# Patient Record
Sex: Male | Born: 1960 | Race: Black or African American | Hispanic: No | Marital: Married | Smoking: Former smoker
Health system: Southern US, Community
[De-identification: ages and names within clinical notes are randomized; demographics above are authoritative.]

## PROBLEM LIST (undated history)

## (undated) DIAGNOSIS — Z789 Other specified health status: Secondary | ICD-10-CM

## (undated) HISTORY — PX: NO PAST SURGERIES: SHX2092

## (undated) HISTORY — DX: Other specified health status: Z78.9

---

## 1997-11-11 ENCOUNTER — Emergency Department (HOSPITAL_COMMUNITY): Admission: EM | Admit: 1997-11-11 | Discharge: 1997-11-11 | Payer: Self-pay | Admitting: *Deleted

## 1997-12-13 ENCOUNTER — Emergency Department (HOSPITAL_COMMUNITY): Admission: EM | Admit: 1997-12-13 | Discharge: 1997-12-13 | Payer: Self-pay

## 2020-04-08 ENCOUNTER — Other Ambulatory Visit: Payer: Self-pay

## 2020-04-08 ENCOUNTER — Emergency Department (HOSPITAL_COMMUNITY): Payer: Self-pay

## 2020-04-08 ENCOUNTER — Emergency Department (HOSPITAL_COMMUNITY)
Admission: EM | Admit: 2020-04-08 | Discharge: 2020-04-08 | Disposition: A | Discharge status: Home / Self Care | Payer: Self-pay | Attending: Emergency Medicine | Admitting: Emergency Medicine

## 2020-04-08 ENCOUNTER — Encounter (HOSPITAL_COMMUNITY): Payer: Self-pay | Admitting: Pharmacy Technician

## 2020-04-08 DIAGNOSIS — S7011XA Contusion of right thigh, initial encounter: Secondary | ICD-10-CM

## 2020-04-08 DIAGNOSIS — R52 Pain, unspecified: Secondary | ICD-10-CM

## 2020-04-08 DIAGNOSIS — T1490XA Injury, unspecified, initial encounter: Secondary | ICD-10-CM

## 2020-04-08 DIAGNOSIS — Z23 Encounter for immunization: Secondary | ICD-10-CM | POA: Insufficient documentation

## 2020-04-08 DIAGNOSIS — Y92410 Unspecified street and highway as the place of occurrence of the external cause: Secondary | ICD-10-CM | POA: Insufficient documentation

## 2020-04-08 DIAGNOSIS — S79921A Unspecified injury of right thigh, initial encounter: Secondary | ICD-10-CM | POA: Diagnosis present

## 2020-04-08 LAB — I-STAT CHEM 8, ED
BUN: 20 mg/dL (ref 6–20)
Calcium, Ion: 1.21 mmol/L (ref 1.15–1.40)
Chloride: 105 mmol/L (ref 98–111)
Creatinine, Ser: 0.9 mg/dL (ref 0.61–1.24)
Glucose, Bld: 91 mg/dL (ref 70–99)
HCT: 40 % (ref 39.0–52.0)
Hemoglobin: 13.6 g/dL (ref 13.0–17.0)
Potassium: 3.8 mmol/L (ref 3.5–5.1)
Sodium: 139 mmol/L (ref 135–145)
TCO2: 23 mmol/L (ref 22–32)

## 2020-04-08 LAB — CBC
HCT: 37.4 % — ABNORMAL LOW (ref 39.0–52.0)
Hemoglobin: 12.7 g/dL — ABNORMAL LOW (ref 13.0–17.0)
MCH: 33.2 pg (ref 26.0–34.0)
MCHC: 34 g/dL (ref 30.0–36.0)
MCV: 97.9 fL (ref 80.0–100.0)
Platelets: 210 10*3/uL (ref 150–400)
RBC: 3.82 MIL/uL — ABNORMAL LOW (ref 4.22–5.81)
RDW: 14 % (ref 11.5–15.5)
WBC: 5 10*3/uL (ref 4.0–10.5)
nRBC: 0 % (ref 0.0–0.2)

## 2020-04-08 LAB — PROTIME-INR
INR: 1 (ref 0.8–1.2)
Prothrombin Time: 12.8 seconds (ref 11.4–15.2)

## 2020-04-08 LAB — COMPREHENSIVE METABOLIC PANEL
ALT: 23 U/L (ref 0–44)
AST: 28 U/L (ref 15–41)
Albumin: 4.1 g/dL (ref 3.5–5.0)
Alkaline Phosphatase: 46 U/L (ref 38–126)
Anion gap: 12 (ref 5–15)
BUN: 16 mg/dL (ref 6–20)
CO2: 21 mmol/L — ABNORMAL LOW (ref 22–32)
Calcium: 9.2 mg/dL (ref 8.9–10.3)
Chloride: 104 mmol/L (ref 98–111)
Creatinine, Ser: 0.82 mg/dL (ref 0.61–1.24)
GFR, Estimated: >60 mL/min (ref >60)
Glucose, Bld: 95 mg/dL (ref 70–99)
Potassium: 3.8 mmol/L (ref 3.5–5.1)
Sodium: 137 mmol/L (ref 135–145)
Total Bilirubin: 0.3 mg/dL (ref 0.3–1.2)
Total Protein: 7.3 g/dL (ref 6.5–8.1)

## 2020-04-08 LAB — ETHANOL: Alcohol, Ethyl (B): 92 mg/dL — ABNORMAL HIGH (ref <10)

## 2020-04-08 LAB — URINALYSIS, ROUTINE W REFLEX MICROSCOPIC
Bilirubin Urine: NEGATIVE
Glucose, UA: NEGATIVE mg/dL
Hgb urine dipstick: NEGATIVE
Ketones, ur: NEGATIVE mg/dL
Leukocytes,Ua: NEGATIVE
Nitrite: NEGATIVE
Protein, ur: NEGATIVE mg/dL
Specific Gravity, Urine: 1.009 (ref 1.005–1.030)
pH: 5 (ref 5.0–8.0)

## 2020-04-08 LAB — SAMPLE TO BLOOD BANK

## 2020-04-08 LAB — LACTIC ACID, PLASMA: Lactic Acid, Venous: 1.6 mmol/L (ref 0.5–1.9)

## 2020-04-08 MED ORDER — FENTANYL CITRATE (PF) 100 MCG/2ML IJ SOLN
50.0000 ug | Freq: Once | INTRAMUSCULAR | Status: AC
  Administered 2020-04-08: 50 ug via INTRAVENOUS
  Filled 2020-04-08: qty 2

## 2020-04-08 MED ORDER — LACTATED RINGERS IV BOLUS
1000.0000 mL | Freq: Once | INTRAVENOUS | Status: AC
  Administered 2020-04-08: 1000 mL via INTRAVENOUS

## 2020-04-08 MED ORDER — TETANUS-DIPHTH-ACELL PERTUSSIS 5-2.5-18.5 LF-MCG/0.5 IM SUSY
0.5000 mL | PREFILLED_SYRINGE | Freq: Once | INTRAMUSCULAR | Status: AC
  Administered 2020-04-08: 0.5 mL via INTRAMUSCULAR
  Filled 2020-04-08: qty 0.5

## 2020-04-08 MED ORDER — IOHEXOL 300 MG/ML  SOLN
100.0000 mL | Freq: Once | INTRAMUSCULAR | Status: AC | PRN
  Administered 2020-04-08: 100 mL via INTRAVENOUS
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
## 2020-04-08 NOTE — Progress Notes (Signed)
Orthopedic Tech Progress Note Patient Details:  Damon Lewis [birth year] [id number]  Ortho Devices Type of Ortho Device: Crutches Ortho Device/Splint Interventions: Adjustment   Post Interventions Instructions Provided: Poper ambulation with device, Adjustment of device   Verlee Pope E Cordelia Bessinger 04/08/2020, 10:11 PM
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
## 2020-04-08 NOTE — ED Notes (Signed)
MD made aware of pt BP  
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
## 2020-04-08 NOTE — ED Notes (Signed)
Patient transported to CT in stable condition via stretcher 
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
## 2020-04-18 ENCOUNTER — Telehealth (HOSPITAL_COMMUNITY): Payer: Self-pay | Admitting: Emergency Medicine

## 2020-04-18 NOTE — Telephone Encounter (Signed)
Attempted to call patient to discuss incidental findings on CT that will require follow-up. Patient was encouraged to follow-up with PCP on discharge from ED but no follow-up visit seen in EMR. Called primary home number listed, no answer, unable to leave message. No answer at work phone and no message left as this appear to be the [employer] number. Unable to send message to MyChart as patient has not activated this yet.   Gershon Mussel, MD 04/18/2020 2:45PM

## 2020-07-22 ENCOUNTER — Ambulatory Visit: Payer: Self-pay | Admitting: *Deleted

## 2020-07-22 NOTE — Telephone Encounter (Signed)
  Call to patient- he had advised nurse that he has been diagnosed with cataract and needs the removed. Patient is losing his slight.Call to office- scheduled NP appointment for evaluation and referral. Reason for Disposition . [1] Blurred vision or visual changes AND [2] gradual onset (e.g., weeks, months)  Answer Assessment - Initial Assessment Questions 1. DESCRIPTION: "What is the vision loss like? Describe it for me." (e.g., complete vision loss, blurred vision, double vision, floaters, etc.)     caetract needs to be removed 2. LOCATION: "One or both eyes?" If one, ask: "Which eye?"     Both eye 3. SEVERITY: "Can you see anything?" If Yes, ask: "What can you see?" (e.g., fine print)     Complete vision loss from R eye and L eye- blurred vision 4. ONSET: "When did this begin?" "Did it start suddenly or has this been gradual?"     Past couple month has gotten worse. 5. PATTERN: "Does this come and go, or has it been constant since it started?"     Constant vision loss 6. PAIN: "Is there any pain in your eye(s)?"  (Scale 1-10; or mild, moderate, severe)     no 7. CONTACTS-GLASSES: "Do you wear contacts or glasses?"     no 8. CAUSE: "What do you think is causing this visual problem?"     cataract  9. OTHER SYMPTOMS: "Do you have any other symptoms?" (e.g., confusion, headache, arm or leg weakness, speech problems)     no 10. PREGNANCY: "Is there any chance you are pregnant?" "When was your last menstrual period?"       n/a  Protocols used: VISION LOSS OR CHANGE-A-AH

## 2020-07-27 ENCOUNTER — Encounter: Payer: Self-pay | Admitting: Family

## 2020-07-27 ENCOUNTER — Telehealth (INDEPENDENT_AMBULATORY_CARE_PROVIDER_SITE_OTHER): Payer: Self-pay | Admitting: Family

## 2020-07-27 DIAGNOSIS — Z7689 Persons encountering health services in other specified circumstances: Secondary | ICD-10-CM

## 2020-07-27 NOTE — Progress Notes (Signed)
Establish care Cataracts concerns

## 2020-07-27 NOTE — Progress Notes (Signed)
Virtual Visit via Telephone Note  I connected with Damon Lewis, on 07/27/2020 at 2:31 PM by telephone due to the COVID-19 pandemic and verified that I am speaking with the correct person using two identifiers.  Due to current restrictions/limitations of in-office visits due to the COVID-19 pandemic, this scheduled clinical appointment was converted to a telehealth visit.   Consent: I discussed the limitations, risks, security and privacy concerns of performing an evaluation and management service by telephone and the availability of in person appointments. I also discussed with the patient that there may be a patient responsible charge related to this service. The patient expressed understanding and agreed to proceed.   Location of Patient: Home  Location of Provider: [facility]  Persons participating in Telemedicine visit: Cammy Copa, NP Margorie John, CMA   History of Present Illness: Damon Lewis is a 60 year-old male who presents to establish care. PMH significant for none. Denies any current issues and/or concerns.    Past Medical History:  Diagnosis Date  . No pertinent past medical history    No Known Allergies  No current outpatient medications on file prior to visit.   No current facility-administered medications on file prior to visit.    Observations/Objective: Alert and oriented x 3. Not in acute distress. Physical examination not completed as this is a telemedicine visit.  Assessment and Plan: 1. Encounter to establish care: - Patient presents today to establish care.  - Return for annual physical examination, labs, and health maintenance. Arrive fasting meaning having had no food and/or nothing to drink for at least 8 hours prior to appointment.  Please take scheduled medications as normal.   Follow Up Instructions: Return for annual physical exam.   Patient was given clear instructions to go to  Emergency Department or return to medical center if symptoms don't improve, worsen, or new problems develop.The patient verbalized understanding.  I discussed the assessment and treatment plan with the patient. The patient was provided an opportunity to ask questions and all were answered. The patient agreed with the plan and demonstrated an understanding of the instructions.   The patient was advised to call back or seek an in-person evaluation if the symptoms worsen or if the condition fails to improve as anticipated.    I provided 10 minutes total of non-face-to-face time during this encounter including median intraservice time, reviewing previous notes, labs, imaging, medications, management and patient verbalized understanding.    Rema Fendt, NP  [facility] [place], [place] [phone] 07/27/2020, 2:31 PM

## 2020-08-03 ENCOUNTER — Encounter: Payer: Self-pay | Admitting: Family

## 2020-08-03 NOTE — Progress Notes (Signed)
Patient did not show for appointment.   
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
## 2023-01-14 ENCOUNTER — Emergency Department (HOSPITAL_COMMUNITY): Payer: 59

## 2023-01-14 ENCOUNTER — Other Ambulatory Visit: Payer: Self-pay

## 2023-01-14 ENCOUNTER — Emergency Department (HOSPITAL_COMMUNITY)
Admission: EM | Admit: 2023-01-14 | Discharge: 2023-01-14 | Disposition: A | Discharge status: Home / Self Care | Payer: 59 | Attending: Emergency Medicine | Admitting: Emergency Medicine

## 2023-01-14 DIAGNOSIS — M25571 Pain in right ankle and joints of right foot: Secondary | ICD-10-CM | POA: Insufficient documentation

## 2023-01-14 DIAGNOSIS — Y92410 Unspecified street and highway as the place of occurrence of the external cause: Secondary | ICD-10-CM | POA: Diagnosis not present

## 2023-01-14 DIAGNOSIS — M545 Low back pain, unspecified: Secondary | ICD-10-CM | POA: Diagnosis present

## 2023-01-14 DIAGNOSIS — I10 Essential (primary) hypertension: Secondary | ICD-10-CM | POA: Diagnosis not present

## 2023-01-14 DIAGNOSIS — R609 Edema, unspecified: Secondary | ICD-10-CM | POA: Diagnosis not present

## 2023-01-14 LAB — URINALYSIS, ROUTINE W REFLEX MICROSCOPIC
Bilirubin Urine: NEGATIVE
Glucose, UA: NEGATIVE mg/dL
Hgb urine dipstick: NEGATIVE
Ketones, ur: NEGATIVE mg/dL
Leukocytes,Ua: NEGATIVE
Nitrite: NEGATIVE
Protein, ur: NEGATIVE mg/dL
Specific Gravity, Urine: 1.029 (ref 1.005–1.030)
pH: 5 (ref 5.0–8.0)

## 2023-01-14 LAB — CBC WITH DIFFERENTIAL/PLATELET
Abs Immature Granulocytes: 0.02 10*3/uL (ref 0.00–0.07)
Basophils Absolute: 0 10*3/uL (ref 0.0–0.1)
Basophils Relative: 1 %
Eosinophils Absolute: 0.2 10*3/uL (ref 0.0–0.5)
Eosinophils Relative: 3 %
HCT: 39.8 % (ref 39.0–52.0)
Hemoglobin: 13.4 g/dL (ref 13.0–17.0)
Immature Granulocytes: 0 %
Lymphocytes Relative: 32 %
Lymphs Abs: 1.8 10*3/uL (ref 0.7–4.0)
MCH: 32.8 pg (ref 26.0–34.0)
MCHC: 33.7 g/dL (ref 30.0–36.0)
MCV: 97.3 fL (ref 80.0–100.0)
Monocytes Absolute: 0.5 10*3/uL (ref 0.1–1.0)
Monocytes Relative: 8 %
Neutro Abs: 3.2 10*3/uL (ref 1.7–7.7)
Neutrophils Relative %: 56 %
Platelets: 225 10*3/uL (ref 150–400)
RBC: 4.09 MIL/uL — ABNORMAL LOW (ref 4.22–5.81)
RDW: 13.2 % (ref 11.5–15.5)
WBC: 5.6 10*3/uL (ref 4.0–10.5)
nRBC: 0 % (ref 0.0–0.2)

## 2023-01-14 LAB — I-STAT CHEM 8, ED
BUN: 17 mg/dL (ref 6–20)
Calcium, Ion: 1.17 mmol/L (ref 1.15–1.40)
Chloride: 103 mmol/L (ref 98–111)
Creatinine, Ser: 0.8 mg/dL (ref 0.61–1.24)
Glucose, Bld: 125 mg/dL — ABNORMAL HIGH (ref 70–99)
HCT: 41 % (ref 39.0–52.0)
Hemoglobin: 13.9 g/dL (ref 13.0–17.0)
Potassium: 4 mmol/L (ref 3.5–5.1)
Sodium: 140 mmol/L (ref 135–145)
TCO2: 26 mmol/L (ref 22–32)

## 2023-01-14 LAB — COMPREHENSIVE METABOLIC PANEL
ALT: 23 U/L (ref 0–44)
AST: 25 U/L (ref 15–41)
Albumin: 4 g/dL (ref 3.5–5.0)
Alkaline Phosphatase: 57 U/L (ref 38–126)
Anion gap: 11 (ref 5–15)
BUN: 16 mg/dL (ref 6–20)
CO2: 24 mmol/L (ref 22–32)
Calcium: 9.5 mg/dL (ref 8.9–10.3)
Chloride: 102 mmol/L (ref 98–111)
Creatinine, Ser: 0.84 mg/dL (ref 0.61–1.24)
GFR, Estimated: >60 mL/min (ref >60)
Glucose, Bld: 123 mg/dL — ABNORMAL HIGH (ref 70–99)
Potassium: 4 mmol/L (ref 3.5–5.1)
Sodium: 137 mmol/L (ref 135–145)
Total Bilirubin: 0.5 mg/dL (ref 0.3–1.2)
Total Protein: 7.2 g/dL (ref 6.5–8.1)

## 2023-01-14 LAB — PROTIME-INR
INR: 1 (ref 0.8–1.2)
Prothrombin Time: 13.4 s (ref 11.4–15.2)

## 2023-01-14 LAB — TYPE AND SCREEN
ABO/RH(D): A NEG
Antibody Screen: NEGATIVE

## 2023-01-14 MED ORDER — IOHEXOL 350 MG/ML SOLN
75.0000 mL | Freq: Once | INTRAVENOUS | Status: AC | PRN
  Administered 2023-01-14: 75 mL via INTRAVENOUS
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
## 2023-01-14 NOTE — Progress Notes (Signed)
Orthopedic Tech Progress Note Patient Details:  Damon Lewis Jun 20, 1960 [id number]  Patient ID: [name], male   DOB: 1960-06-21, 62 y.o.   MRN: [id number] Level II; not currently needed. Darleen Crocker 01/14/2023, 5:47 PM
